# Patient Record
Sex: Female | Born: 1975 | Race: White | Hispanic: No | Marital: Married | Smoking: Never smoker
Health system: Southern US, Community
[De-identification: ages and names within clinical notes are randomized; demographics above are authoritative.]

## PROBLEM LIST (undated history)

## (undated) DIAGNOSIS — D649 Anemia, unspecified: Secondary | ICD-10-CM

## (undated) DIAGNOSIS — E079 Disorder of thyroid, unspecified: Secondary | ICD-10-CM

## (undated) HISTORY — PX: CHOLECYSTECTOMY: SHX55

---

## 2006-10-11 ENCOUNTER — Ambulatory Visit: Payer: Self-pay | Admitting: Otolaryngology

## 2007-05-14 ENCOUNTER — Ambulatory Visit: Payer: Self-pay | Admitting: Otolaryngology

## 2007-06-17 ENCOUNTER — Emergency Department: Payer: Self-pay | Admitting: Emergency Medicine

## 2007-07-14 ENCOUNTER — Ambulatory Visit: Payer: Self-pay | Admitting: Internal Medicine

## 2017-04-20 ENCOUNTER — Emergency Department
Admission: EM | Admit: 2017-04-20 | Discharge: 2017-04-20 | Disposition: A | Discharge status: Home / Self Care | Payer: BC Managed Care – PPO | Attending: Emergency Medicine | Admitting: Emergency Medicine

## 2017-04-20 ENCOUNTER — Encounter: Payer: Self-pay | Admitting: Emergency Medicine

## 2017-04-20 ENCOUNTER — Emergency Department: Payer: BC Managed Care – PPO

## 2017-04-20 DIAGNOSIS — Y939 Activity, unspecified: Secondary | ICD-10-CM | POA: Diagnosis not present

## 2017-04-20 DIAGNOSIS — Y9289 Other specified places as the place of occurrence of the external cause: Secondary | ICD-10-CM | POA: Diagnosis not present

## 2017-04-20 DIAGNOSIS — W19XXXA Unspecified fall, initial encounter: Secondary | ICD-10-CM | POA: Diagnosis not present

## 2017-04-20 DIAGNOSIS — S8255XA Nondisplaced fracture of medial malleolus of left tibia, initial encounter for closed fracture: Secondary | ICD-10-CM | POA: Insufficient documentation

## 2017-04-20 DIAGNOSIS — Y999 Unspecified external cause status: Secondary | ICD-10-CM | POA: Insufficient documentation

## 2017-04-20 DIAGNOSIS — S99912A Unspecified injury of left ankle, initial encounter: Secondary | ICD-10-CM | POA: Diagnosis present

## 2017-04-20 HISTORY — DX: Anemia, unspecified: D64.9

## 2017-04-20 HISTORY — DX: Disorder of thyroid, unspecified: E07.9

## 2017-04-20 MED ORDER — HYDROCODONE-ACETAMINOPHEN 5-325 MG PO TABS: 1.0000 | ORAL_TABLET | Freq: Three times a day (TID) | ORAL | 0 refills | Status: AC | PRN

## 2017-04-20 MED ORDER — KETOROLAC TROMETHAMINE 10 MG PO TABS: 10.0000 mg | ORAL_TABLET | Freq: Three times a day (TID) | ORAL | 0 refills | Status: AC

## 2017-04-20 MED ORDER — KETOROLAC TROMETHAMINE 30 MG/ML IJ SOLN
30.0000 mg | Freq: Once | INTRAMUSCULAR | Status: AC
  Administered 2017-04-20: 30 mg via INTRAVENOUS
  Filled 2017-04-20: qty 1

## 2017-04-20 MED ORDER — HYDROCODONE-ACETAMINOPHEN 5-325 MG PO TABS: 1.0000 | ORAL_TABLET | Freq: Three times a day (TID) | ORAL | 0 refills | Status: DC | PRN

## 2017-04-20 MED ORDER — ORPHENADRINE CITRATE 30 MG/ML IJ SOLN
60.0000 mg | INTRAMUSCULAR | Status: AC
  Administered 2017-04-20: 60 mg via INTRAVENOUS
  Filled 2017-04-20: qty 2

## 2017-04-20 MED ORDER — CYCLOBENZAPRINE HCL 5 MG PO TABS: 5.0000 mg | ORAL_TABLET | Freq: Three times a day (TID) | ORAL | 0 refills | Status: AC | PRN

## 2017-04-20 NOTE — Discharge Instructions (Addendum)
You are being treated for a non-displaced medial ankle fracture. Wear the splint until your are evaluated by Dr. Ernest PineHooten. Call his office tomorrow to schedule a follow-up visit when you return to town. Rest with the leg elevated and apply ice over the splint. Take the pain medicine as needed and the anti-inflammatory and muscle relaxant as directed.

## 2017-04-20 NOTE — ED Triage Notes (Signed)
Pt to ED via EMS from mechanical fall [facility] [employer] [facility]. Pt has + swelling and deformity to LFT ankle. Pt unable to bear weight. Pt denies any other injuries. Per EMS started IV and gave 50 mcg fentanyl on scene and another 25 mcg fent prior to arrival.

## 2017-04-20 NOTE — ED Provider Notes (Signed)
[facility] Emergency Department Provider Note ____________________________________________  Time seen: 1405  I have reviewed the triage vital signs and the nursing notes.  HISTORY  Chief Complaint  Fall  HPI Brenda Golden is a 41 y.o. female presents to the ED via EMS, from the scene of a local restaurant where she sustained a mechanical fall. The patient presents now with pain and disability to the left ankle.There is reported deformity to the left ankle at this time. Patient received 50 g of fentanyl on the scene via IV, and 25 g just prior to arrival in the ED. She denies any other injury at this time.  Past Medical History:  Diagnosis Date  . Anemia   . Thyroid disease     There are no active problems to display for this patient.   Past Surgical History:  Procedure Laterality Date  . CHOLECYSTECTOMY      Prior to Admission medications   Medication Sig Start Date End Date Taking? Authorizing Provider  cyclobenzaprine (FLEXERIL) 5 MG tablet Take 1 tablet (5 mg total) by mouth 3 (three) times daily as needed for muscle spasms. 04/20/17   Destynie Toomey, Charlesetta IvoryJenise V Bacon, PA-C  HYDROcodone-acetaminophen (NORCO) 5-325 MG tablet Take 1 tablet by mouth 3 (three) times daily as needed. 04/20/17 04/30/17  Keaston Pile, Charlesetta IvoryJenise V Bacon, PA-C  ketorolac (TORADOL) 10 MG tablet Take 1 tablet (10 mg total) by mouth every 8 (eight) hours. 04/20/17   Letishia Elliott, Charlesetta IvoryJenise V Bacon, PA-C    Allergies Fluconazole  No family history on file.  Social History Social History  Substance Use Topics  . Smoking status: Never Smoker  . Smokeless tobacco: Never Used  . Alcohol use Yes     Comment: ocasional     Review of Systems  Constitutional: Negative for fever. Cardiovascular: Negative for chest pain. Respiratory: Negative for shortness of breath. Gastrointestinal: Negative for abdominal pain, vomiting and diarrhea. Musculoskeletal: Negative for back pain. Left ankle pain & deformity   Skin: Negative for rash. Neurological: Negative for headaches, focal weakness or numbness. ____________________________________________  PHYSICAL EXAM:  VITAL SIGNS: ED Triage Vitals [04/20/17 1352]  Enc Vitals Group     BP 118/72     Pulse Rate 95     Resp 16     Temp 98.1 F (36.7 C)     Temp Source Oral     SpO2 98 %     Weight 205 lb (93 kg)     Height 5\' 7"  (1.702 m)     Head Circumference      Peak Flow      Pain Score 4     Pain Loc      Pain Edu?      Excl. in GC?     Constitutional: Alert and oriented. Well appearing and in no distress. Head: Normocephalic and atraumatic. Cardiovascular: Normal rate, regular rhythm. Normal distal pulses. Respiratory: Normal respiratory effort.  Musculoskeletal: Nontender with normal range of motion in all extremities.  Neurologic:  Normal gait without ataxia. Normal speech and language. No gross focal neurologic deficits are appreciated. Skin:  Skin is warm, dry and intact. No rash noted. Psychiatric: Mood and affect are normal. Patient exhibits appropriate insight and judgment. ____________________________________________   RADIOLOGY  Left Ankle  IMPRESSION: Nondisplaced intra-articular posterior malleolar left ankle Fracture.  I, Elizardo Chilson, Charlesetta IvoryJenise V Bacon, personally viewed and evaluated these images (plain radiographs) as part of my medical decision making, as well as reviewing the written report by the radiologist. ____________________________________________  PROCEDURES  Short leg OCL splint Crutches ____________________________________________  INITIAL IMPRESSION / ASSESSMENT AND PLAN / ED COURSE  Patient with initial fracture management of a closed medial malleolar fracture. She is placed in an appropriate splint and given crutches for non-weightbearing gait assistance. She will call Dr. Ernest Pine tomorrow for follow-up scheduling ahead of her trip out-of-town. Prescriptions for Norco (#30), ketorolac (#15), and  cyclobenzaprine (#15) are provided.  ____________________________________________  FINAL CLINICAL IMPRESSION(S) / ED DIAGNOSES  Final diagnoses:  Closed traumatic nondisplaced fracture of medial malleolus of left tibia, initial encounter      Lissa Hoard, PA-C 04/20/17 Herbie Baltimore    Loleta Rose, MD 04/20/17 2035

## 2017-04-20 NOTE — ED Notes (Signed)
See triage note for assessment 

## 2018-10-16 ENCOUNTER — Other Ambulatory Visit: Payer: Self-pay

## 2018-10-16 ENCOUNTER — Emergency Department: Payer: BC Managed Care – PPO

## 2018-10-16 ENCOUNTER — Emergency Department
Admission: EM | Admit: 2018-10-16 | Discharge: 2018-10-16 | Disposition: A | Discharge status: Home / Self Care | Payer: BC Managed Care – PPO | Attending: Emergency Medicine | Admitting: Emergency Medicine

## 2018-10-16 DIAGNOSIS — Y92009 Unspecified place in unspecified non-institutional (private) residence as the place of occurrence of the external cause: Secondary | ICD-10-CM | POA: Insufficient documentation

## 2018-10-16 DIAGNOSIS — Y998 Other external cause status: Secondary | ICD-10-CM | POA: Diagnosis not present

## 2018-10-16 DIAGNOSIS — Y9389 Activity, other specified: Secondary | ICD-10-CM | POA: Insufficient documentation

## 2018-10-16 DIAGNOSIS — W010XXA Fall on same level from slipping, tripping and stumbling without subsequent striking against object, initial encounter: Secondary | ICD-10-CM | POA: Insufficient documentation

## 2018-10-16 DIAGNOSIS — S0990XA Unspecified injury of head, initial encounter: Secondary | ICD-10-CM | POA: Diagnosis present

## 2018-10-16 DIAGNOSIS — S060X1A Concussion with loss of consciousness of 30 minutes or less, initial encounter: Secondary | ICD-10-CM | POA: Diagnosis not present

## 2018-10-16 NOTE — ED Notes (Signed)
Pt slipped and fell hitting her head on concrete at 1545. No vomiting. IBU taken at home.

## 2018-10-16 NOTE — ED Triage Notes (Signed)
PT States she slipped and fell on a rug, falling backwards and striking her head, states she did having LOC for a minute. Pt is a/ox4, PERRLA, pt c/o HA with nausea and dizziness. Denies any deficits at present.

## 2018-10-16 NOTE — ED Provider Notes (Signed)
[facility] Emergency Department Provider Note  ____________________________________________   First MD Initiated Contact with Patient 10/16/18 1955     (approximate)  I have reviewed the triage vital signs and the nursing notes.   HISTORY  Chief Complaint Head Injury    HPI Brenda Golden is a 42 y.o. female presents to the emergency department following accidental slip and fall resultant occipital head injury.  Patient states that she slipped and fell striking the back of her head with approximately 1 minute loss of consciousness..  Patient states that she has had headache nausea and dizziness since the event however states that the dizziness has resolved headache has persisted but is mild at this time.  Patient denies any weakness no numbness or change in speech.   Past Medical History:  Diagnosis Date  . Anemia   . Thyroid disease     There are no active problems to display for this patient.   Past Surgical History:  Procedure Laterality Date  . CHOLECYSTECTOMY      Prior to Admission medications   Medication Sig Start Date End Date Taking? Authorizing Provider  cyclobenzaprine (FLEXERIL) 5 MG tablet Take 1 tablet (5 mg total) by mouth 3 (three) times daily as needed for muscle spasms. 04/20/17   Menshew, Charlesetta IvoryJenise V Bacon, PA-C  ketorolac (TORADOL) 10 MG tablet Take 1 tablet (10 mg total) by mouth every 8 (eight) hours. 04/20/17   Menshew, Charlesetta IvoryJenise V Bacon, PA-C    Allergies Fluconazole  No family history on file.  Social History Social History   Tobacco Use  . Smoking status: Never Smoker  . Smokeless tobacco: Never Used  Substance Use Topics  . Alcohol use: Yes    Comment: ocasional   . Drug use: No    Review of Systems Constitutional: No fever/chills Eyes: No visual changes. ENT: No sore throat. Cardiovascular: Denies chest pain. Respiratory: Denies shortness of breath. Gastrointestinal: No abdominal pain.  No nausea, no vomiting.   No diarrhea.  No constipation. Genitourinary: Negative for dysuria. Musculoskeletal: Negative for neck pain.  Negative for back pain. Integumentary: Negative for rash. Neurological: Negative for headaches, focal weakness or numbness.   ____________________________________________   PHYSICAL EXAM:  VITAL SIGNS: ED Triage Vitals  Enc Vitals Group     BP 10/16/18 1714 116/90     Pulse Rate 10/16/18 1714 76     Resp 10/16/18 1714 17     Temp 10/16/18 1714 98.1 F (36.7 C)     Temp Source 10/16/18 1714 Oral     SpO2 10/16/18 1714 98 %     Weight 10/16/18 1715 97.5 kg (215 lb)     Height 10/16/18 1715 1.702 m (5\' 7" )     Head Circumference --      Peak Flow --      Pain Score 10/16/18 1715 7     Pain Loc --      Pain Edu? --      Excl. in GC? --     Constitutional: Alert and oriented. Well appearing and in no acute distress. Eyes: Conjunctivae are normal. PERRL. EOMI. Head: Atraumatic. Mouth/Throat: Mucous membranes are moist.  Oropharynx non-erythematous. Neck: No stridor.  No cervical spine tenderness to palpation. Cardiovascular: Normal rate, regular rhythm. Good peripheral circulation. Grossly normal heart sounds. Respiratory: Normal respiratory effort.  No retractions. Lungs CTAB. Musculoskeletal: No lower extremity tenderness nor edema. No gross deformities of extremities. Neurologic:  Normal speech and language. No gross focal neurologic deficits are  appreciated.  Skin:  Skin is warm, dry and intact. No rash noted. Psychiatric: Mood and affect are normal. Speech and behavior are normal.  ____________________________________________ ____  RADIOLOGY I, Darci CurrentANDOLPH N Avian Greenawalt, personally viewed and evaluated these images (plain radiographs) as part of my medical decision making, as well as reviewing the written report by the radiologist.  ED MD interpretation: No focal acute intracranial abnormality identified on CT head per radiologist.  Official radiology report(s): Ct  Head Wo Contrast  Result Date: 10/16/2018 CLINICAL DATA:  Status post fall hitting back of head. EXAM: CT HEAD WITHOUT CONTRAST TECHNIQUE: Contiguous axial images were obtained from the base of the skull through the vertex without intravenous contrast. COMPARISON:  None. FINDINGS: Brain: No evidence of acute infarction, hemorrhage, hydrocephalus, extra-axial collection or mass lesion/mass effect. Vascular: No hyperdense vessel or unexpected calcification. Skull: Normal. Negative for fracture or focal lesion. Sinuses/Orbits: No acute finding. Other: None. IMPRESSION: No focal acute intracranial abnormality identified. Electronically Signed   By: Sherian ReinWei-Chen  Lin M.D.   On: 10/16/2018 17:48     Procedures   ____________________________________________   INITIAL IMPRESSION / ASSESSMENT AND PLAN / ED COURSE  As part of my medical decision making, I reviewed the following data within the electronic MEDICAL RECORD NUMBER  42 year old female presented with above-stated history and physical exam concerning for concussion versus other potential intracranial abnormality.  CT head revealed no acute intracranial findings.  Patient symptoms consistent with a concussion.  Spoke with the patient at length regarding concussion. ____________________________________________  FINAL CLINICAL IMPRESSION(S) / ED DIAGNOSES  Final diagnoses:  Concussion with loss of consciousness of 30 minutes or less, initial encounter     MEDICATIONS GIVEN DURING THIS VISIT:  Medications - No data to display   ED Discharge Orders    None       Note:  This document was prepared using Dragon voice recognition software and may include unintentional dictation errors.    Darci CurrentBrown, [place] N, MD 10/16/18 [phone]

## 2018-10-26 ENCOUNTER — Other Ambulatory Visit: Payer: Self-pay

## 2018-10-26 ENCOUNTER — Emergency Department
Admission: EM | Admit: 2018-10-26 | Discharge: 2018-10-26 | Disposition: A | Discharge status: Home / Self Care | Payer: BC Managed Care – PPO | Attending: Emergency Medicine | Admitting: Emergency Medicine

## 2018-10-26 ENCOUNTER — Emergency Department: Payer: BC Managed Care – PPO

## 2018-10-26 DIAGNOSIS — Z9049 Acquired absence of other specified parts of digestive tract: Secondary | ICD-10-CM | POA: Diagnosis not present

## 2018-10-26 DIAGNOSIS — R140 Abdominal distension (gaseous): Secondary | ICD-10-CM | POA: Diagnosis not present

## 2018-10-26 DIAGNOSIS — R1084 Generalized abdominal pain: Secondary | ICD-10-CM

## 2018-10-26 DIAGNOSIS — R109 Unspecified abdominal pain: Secondary | ICD-10-CM | POA: Diagnosis present

## 2018-10-26 LAB — URINALYSIS, COMPLETE (UACMP) WITH MICROSCOPIC
BILIRUBIN URINE: NEGATIVE
Bacteria, UA: NONE SEEN
GLUCOSE, UA: NEGATIVE mg/dL
Ketones, ur: NEGATIVE mg/dL
LEUKOCYTES UA: NEGATIVE
NITRITE: NEGATIVE
PH: 5 (ref 5.0–8.0)
Protein, ur: NEGATIVE mg/dL
SPECIFIC GRAVITY, URINE: 1.026 (ref 1.005–1.030)

## 2018-10-26 LAB — COMPREHENSIVE METABOLIC PANEL
ALK PHOS: 75 U/L (ref 38–126)
ALT: 54 U/L — ABNORMAL HIGH (ref 0–44)
ANION GAP: 9 (ref 5–15)
AST: 27 U/L (ref 15–41)
Albumin: 4.2 g/dL (ref 3.5–5.0)
BUN: 13 mg/dL (ref 6–20)
CALCIUM: 8.6 mg/dL — AB (ref 8.9–10.3)
CO2: 22 mmol/L (ref 22–32)
Chloride: 102 mmol/L (ref 98–111)
Creatinine, Ser: 0.83 mg/dL (ref 0.44–1.00)
GFR calc non Af Amer: >60 mL/min (ref >60)
Glucose, Bld: 105 mg/dL — ABNORMAL HIGH (ref 70–99)
Potassium: 3.5 mmol/L (ref 3.5–5.1)
Sodium: 133 mmol/L — ABNORMAL LOW (ref 135–145)
Total Bilirubin: 0.6 mg/dL (ref 0.3–1.2)
Total Protein: 7.7 g/dL (ref 6.5–8.1)

## 2018-10-26 LAB — CBC
HCT: 46.3 % — ABNORMAL HIGH (ref 36.0–46.0)
Hemoglobin: 15 g/dL (ref 12.0–15.0)
MCH: 29.8 pg (ref 26.0–34.0)
MCHC: 32.4 g/dL (ref 30.0–36.0)
MCV: 91.9 fL (ref 80.0–100.0)
NRBC: 0 % (ref 0.0–0.2)
PLATELETS: 318 10*3/uL (ref 150–400)
RBC: 5.04 MIL/uL (ref 3.87–5.11)
RDW: 12.4 % (ref 11.5–15.5)
WBC: 9.4 10*3/uL (ref 4.0–10.5)

## 2018-10-26 LAB — LIPASE, BLOOD: LIPASE: 24 U/L (ref 11–51)

## 2018-10-26 LAB — POCT PREGNANCY, URINE: Preg Test, Ur: NEGATIVE

## 2018-10-26 MED ORDER — ONDANSETRON 4 MG PO TBDP: 4.0000 mg | ORAL_TABLET | Freq: Three times a day (TID) | ORAL | 0 refills | Status: AC | PRN

## 2018-10-26 MED ORDER — SIMETHICONE 80 MG PO CHEW
80.0000 mg | CHEWABLE_TABLET | Freq: Once | ORAL | Status: AC
  Administered 2018-10-26: 80 mg via ORAL
  Filled 2018-10-26: qty 1

## 2018-10-26 MED ORDER — KETOROLAC TROMETHAMINE 30 MG/ML IJ SOLN
30.0000 mg | Freq: Once | INTRAMUSCULAR | Status: AC
  Administered 2018-10-26: 30 mg via INTRAMUSCULAR
  Filled 2018-10-26: qty 1

## 2018-10-26 MED ORDER — SIMETHICONE 80 MG PO CHEW: 80.0000 mg | CHEWABLE_TABLET | Freq: Four times a day (QID) | ORAL | 0 refills | Status: AC | PRN

## 2018-10-26 MED ORDER — ONDANSETRON 4 MG PO TBDP
4.0000 mg | ORAL_TABLET | Freq: Once | ORAL | Status: AC
  Administered 2018-10-26: 4 mg via ORAL
  Filled 2018-10-26: qty 1

## 2018-10-26 NOTE — ED Triage Notes (Signed)
Pt states that she started taking a weight loss medication mon, tues, and wed, wed became sick with vomiting, upper abd pain radiating around to her back (reminds her of her gallbladder issues prior to surg)  Pt states that she cont to have a lot of gas, and diarrhea with the abd pain

## 2018-10-26 NOTE — ED Notes (Signed)

## 2018-10-26 NOTE — Discharge Instructions (Addendum)
Please drink plenty of fluid to stay well-hydrated.  To decrease gas in your stomach, you may take simethicone, and stay active.  Walking can help move gas out of your bowel.  You may take Zofran if you develop nausea or vomiting.  You may take Tylenol or Motrin for pain.  If you take Motrin or other NSAID medications, take it with a small amount of food to prevent irritation of your stomach.  Return to the emergency department if you develop severe pain, lightheadedness or fainting, fever, or any other symptoms concerning to you.

## 2018-10-26 NOTE — ED Provider Notes (Signed)
[facility] Emergency Department Provider Note  ____________________________________________  Time seen: Approximately 2:31 PM  I have reviewed the triage vital signs and the nursing notes.   HISTORY  Chief Complaint Abdominal Pain; Emesis; and Diarrhea    HPI Brenda Golden is a 43 y.o. female that is post remote cholecystectomy, nonpregnant, presenting with diffuse abdominal pain, nausea and vomiting now resolved.  The patient reports that 5 days ago, she started an over-the-counter weight loss medication which she has tolerated before.  3 days later, she had multiple episodes of nausea, vomiting and nonbloody diarrhea, which have since resolved.  However, she also developed and upper abdominal pain that radiated around to the back and felt like a cramping sensation and has been persistent.  It is worse if she eats or drinks.  She is able to tolerate liquids and solids without vomiting however.  Yesterday, she took Motrin for mild headache, and this did not help her abdominal pain.  No fevers or chills, dysuria, change in vaginal discharge.  Past Medical History:  Diagnosis Date  . Anemia   . Thyroid disease     There are no active problems to display for this patient.   Past Surgical History:  Procedure Laterality Date  . CHOLECYSTECTOMY      Current Outpatient Rx  . Order #: [id number] Class: Print  . Order #: [id number] Class: Print  . Order #: [id number] Class: Print  . Order #: [id number] Class: Print    Allergies Fluconazole  No family history on file.  Social History Social History   Tobacco Use  . Smoking status: Never Smoker  . Smokeless tobacco: Never Used  Substance Use Topics  . Alcohol use: Yes    Comment: ocasional   . Drug use: No    Review of Systems Constitutional: No fever/chills.  No lightheadedness or syncope. Eyes: No visual changes. ENT: No sore throat. No congestion or rhinorrhea. Cardiovascular: Denies chest pain. Denies  palpitations. Respiratory: Denies shortness of breath.  No cough. Gastrointestinal: Positive upper abdominal pain radiating to the back.  Positive nausea vomiting and diarrhea; vomiting and diarrhea now resolved..  No constipation. Genitourinary: Negative for dysuria.  No urinary frequency.  No change in vaginal discharge. Musculoskeletal: Radiating pain to the back.   Skin: Negative for rash. Neurological: Negative for headaches. No focal numbness, tingling or weakness.     ____________________________________________   PHYSICAL EXAM:  VITAL SIGNS: ED Triage Vitals  Enc Vitals Group     BP 10/26/18 1144 106/89     Pulse Rate 10/26/18 1144 96     Resp 10/26/18 1144 16     Temp 10/26/18 1144 98.2 F (36.8 C)     Temp Source 10/26/18 1144 Oral     SpO2 10/26/18 1144 97 %     Weight 10/26/18 1145 215 lb (97.5 kg)     Height 10/26/18 1145 5\' 7"  (1.702 m)     Head Circumference --      Peak Flow --      Pain Score 10/26/18 1145 8     Pain Loc --      Pain Edu? --      Excl. in GC? --     Constitutional: Alert and oriented.  Answers questions appropriately. Eyes: Conjunctivae are normal.  EOMI. No scleral icterus. Head: Atraumatic. Nose: No congestion/rhinnorhea. Mouth/Throat: Mucous membranes are mildly dry.  Neck: No stridor.  Supple.  No JVD.  No meningismus. Cardiovascular: Normal rate, regular rhythm. No murmurs, rubs or gallops.  Respiratory: Normal respiratory effort.  No accessory muscle use or retractions. Lungs CTAB.  No wheezes, rales or ronchi. Gastrointestinal: Soft, and mild distention.  Tender to palpation diffusely in all 4 quadrants.  No guarding or rebound.  No peritoneal signs. Musculoskeletal: No LE edema. No ttp in the calves or palpable cords.  Negative Homan's sign. Neurologic:  A&Ox3.  Speech is clear.  Face and smile are symmetric.  EOMI.  Moves all extremities well. Skin:  Skin is warm, dry and intact. No rash noted. Psychiatric: Mood and affect are  normal. Speech and behavior are normal.  Normal judgement.  ____________________________________________   LABS (all labs ordered are listed, but only abnormal results are displayed)  Labs Reviewed  COMPREHENSIVE METABOLIC PANEL - Abnormal; Notable for the following components:      Result Value   Sodium 133 (*)    Glucose, Bld 105 (*)    Calcium 8.6 (*)    ALT 54 (*)    All other components within normal limits  CBC - Abnormal; Notable for the following components:   HCT 46.3 (*)    All other components within normal limits  URINALYSIS, COMPLETE (UACMP) WITH MICROSCOPIC - Abnormal; Notable for the following components:   Color, Urine YELLOW (*)    APPearance HAZY (*)    Hgb urine dipstick MODERATE (*)    All other components within normal limits  LIPASE, BLOOD  POC URINE PREG, ED  POCT PREGNANCY, URINE   ____________________________________________  EKG  ED ECG REPORT I, Anne-Caroline Sharma Covert, the attending physician, personally viewed and interpreted this ECG.   Date: 10/26/2018  EKG Time: 1452  Rate: 71  Rhythm: normal sinus rhythm  Axis: normal  Intervals:none  ST&T Change: No STEMI  ____________________________________________  RADIOLOGY  Dg Abdomen 1 View  Result Date: 10/26/2018 CLINICAL DATA:  Upper abdominal pain with vomiting and diarrhea. EXAM: ABDOMEN - 1 VIEW COMPARISON:  None. FINDINGS: There are a few mildly prominent, nondilated loops of small bowel. No obstruction. No radio-opaque calculi or other significant radiographic abnormality are seen. Prior cholecystectomy. Surgical clip in the right lower quadrant. No acute osseous abnormality. IMPRESSION: 1. A few mildly prominent, nondilated loops of small bowel are nonspecific, but could reflect enteritis. No obstruction. Electronically Signed   By: Obie Dredge M.D.   On: 10/26/2018 14:50    ____________________________________________   PROCEDURES  Procedure(s) performed:  None  Procedures  Critical Care performed: No ____________________________________________   INITIAL IMPRESSION / ASSESSMENT AND PLAN / ED COURSE  Pertinent labs & imaging results that were available during my care of the patient were reviewed by me and considered in my medical decision making (see chart for details).  43 y.o. female status post remote cholecystectomy, overweight, presenting with resolved vomiting and diarrhea, with ongoing abdominal distention and diffuse abdominal pain.  Overall, the patient's hemodynamically stable and afebrile.  Her clinical presentation is most consistent with gas and I will treat her with simethicone.  Given that she is no longer having vomiting, obstruction is less likely.  I do not suspect an acute severe intra-abdominal infectious or surgical pathology, including diverticulitis, appendicitis.  We will get a plain x-ray, and if it is reassuring and she is feeling better, I will plan to discharge her home.  Follow-up instructions as well as return precautions were discussed.  ----------------------------------------- 4:01 PM on 10/26/2018 -----------------------------------------  The patient's work-up in the emergency department has been reassuring.  She states that her pain has almost completely resolved and that  she is feeling significantly better.  She is able to tolerate liquids without any difficulty.  She has been able to ambulate without any pain.  The patient's x-ray did show some mildly distended loops of bowel, which is consistent with her distention; her imaging and clinical presentation are not consistent with obstruction.  At this time, the patient is safe for discharge home.  Return precautions as well as follow-up instructions were discussed.  ____________________________________________  FINAL CLINICAL IMPRESSION(S) / ED DIAGNOSES  Final diagnoses:  Abdominal distention  Generalized abdominal pain         NEW MEDICATIONS  STARTED DURING THIS VISIT:  New Prescriptions   ONDANSETRON (ZOFRAN ODT) 4 MG DISINTEGRATING TABLET    Take 1 tablet (4 mg total) by mouth every 8 (eight) hours as needed for nausea or vomiting.   SIMETHICONE (GAS-X) 80 MG CHEWABLE TABLET    Chew 1 tablet (80 mg total) by mouth 4 (four) times daily as needed for flatulence.      Rockne MenghiniNorman, Anne-Caroline, MD 10/26/18 [phone]

## 2020-06-17 IMAGING — CT CT HEAD W/O CM
3 series · 15 of 45 positions shown, 18 images · non-contrast
Comparison: None.

CLINICAL DATA: Status post fall hitting back of head.

EXAM:
CT HEAD WITHOUT CONTRAST
TECHNIQUE: Contiguous axial images were obtained from the base of the skull
through the vertex without intravenous contrast.

[Series 3: head wo · axial · 0.41mm/px · z∈[-116,-1]mm · 9 of 28 slices shown, 12 images]
[im 3/28  brain]
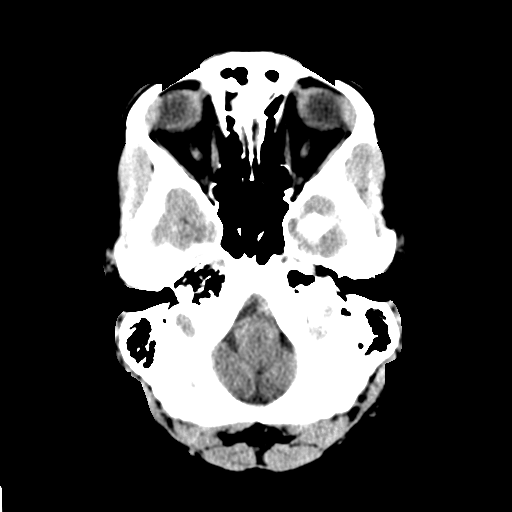
[im 3/28  bone]
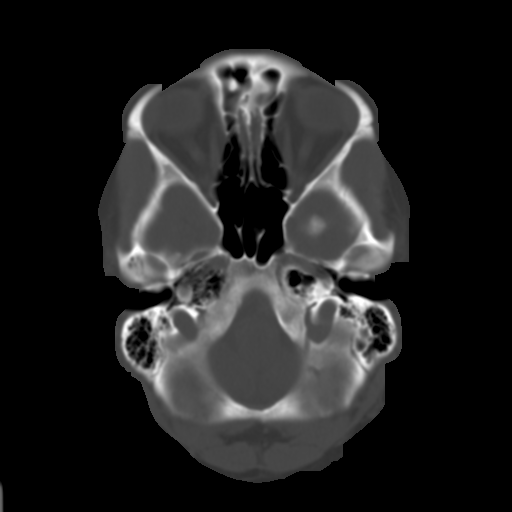
[im 6/28  brain]
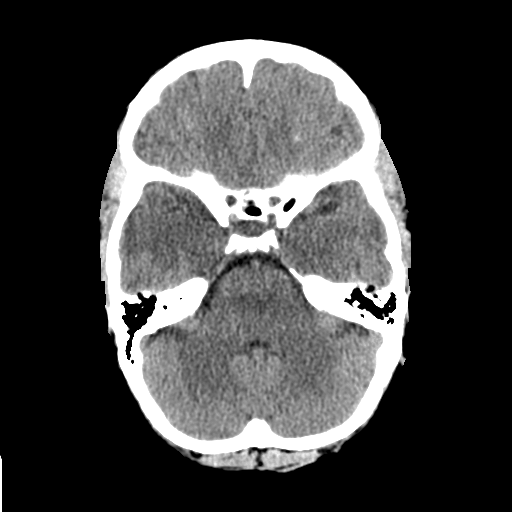
[im 9/28  brain]
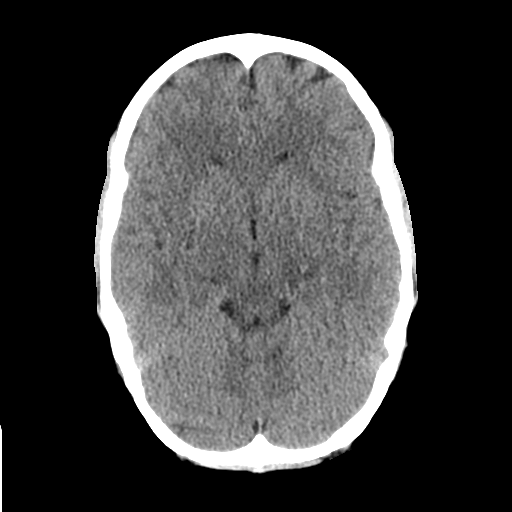
[im 12/28  brain]
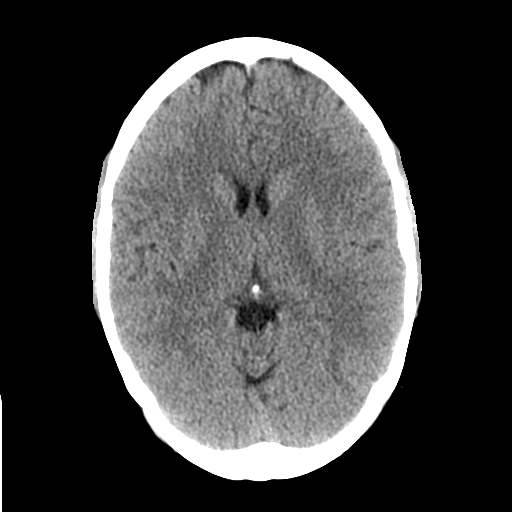
[im 15/28  brain]
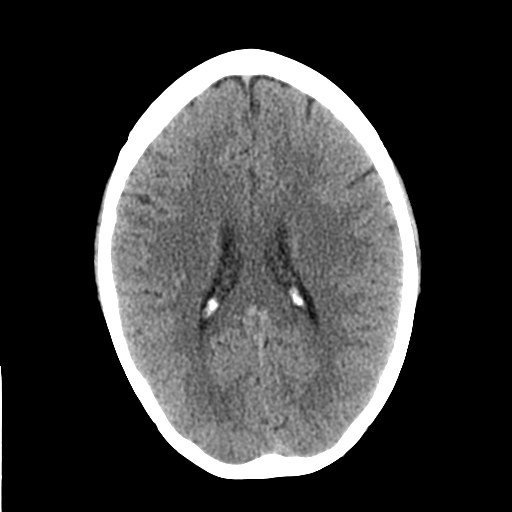
[im 15/28  bone]
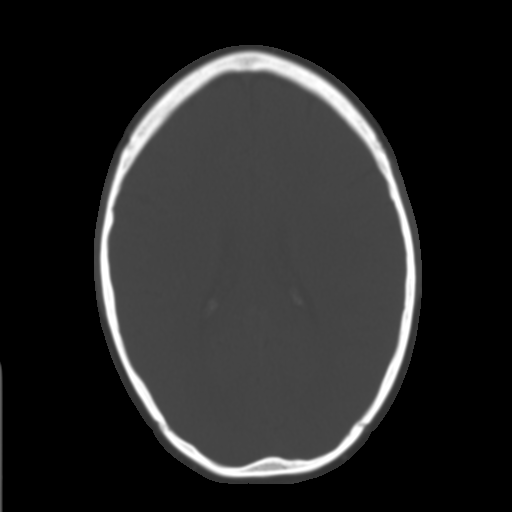
[im 17/28  brain]
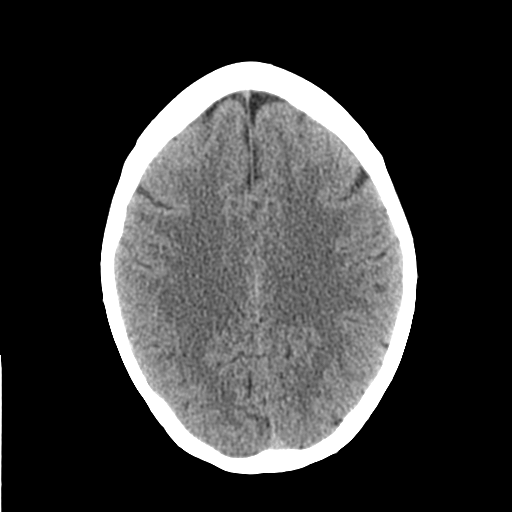
[im 20/28  brain]
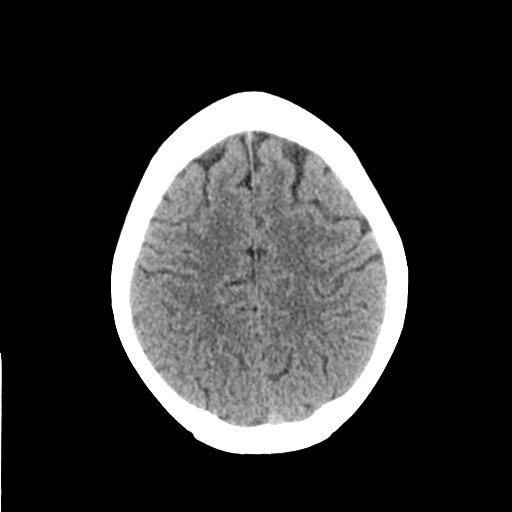
[im 23/28  brain]
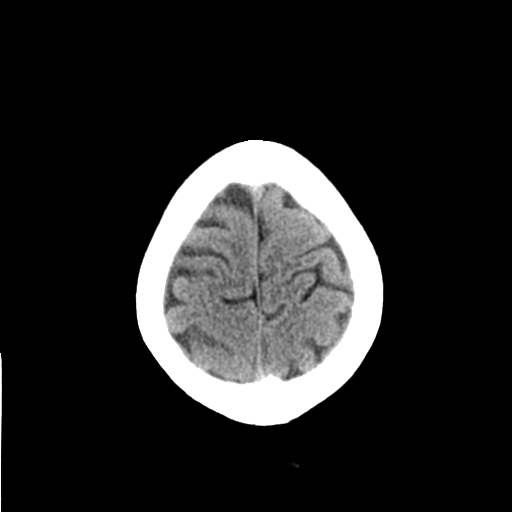
[im 26/28  brain]
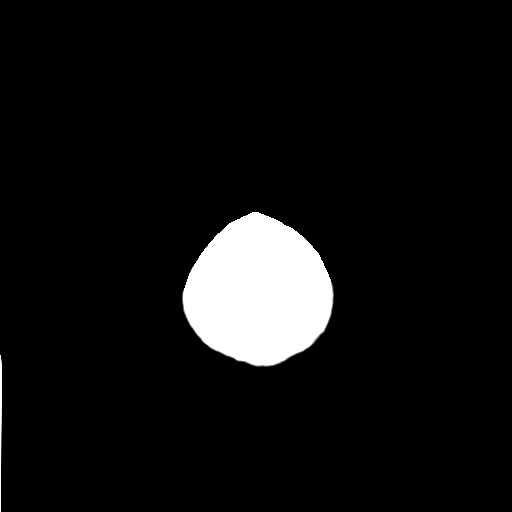
[im 26/28  bone]
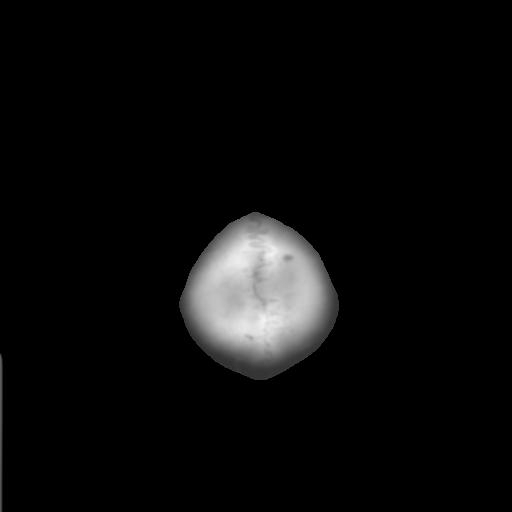

[Series 4: coronal soft tissue · coronal · 0.30mm/px · 3 of 63 slices shown]
[im 21/63  brain]
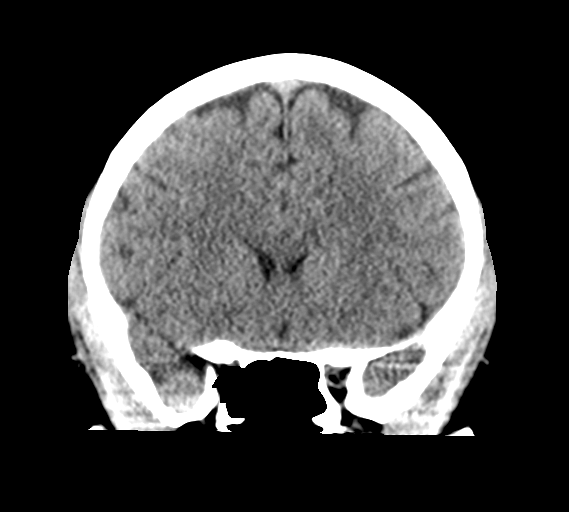
[im 28/63  brain]
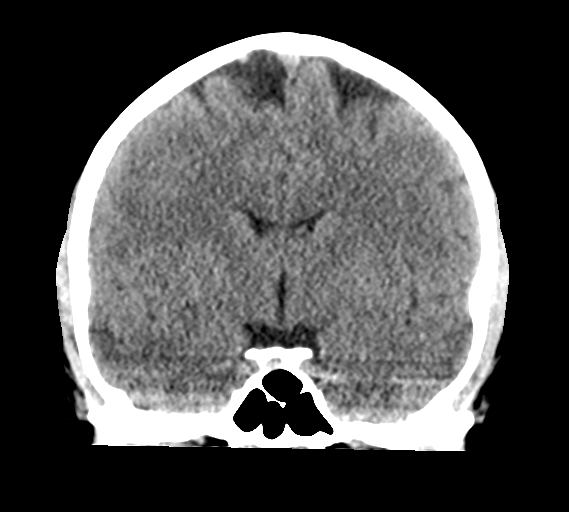
[im 35/63  brain]
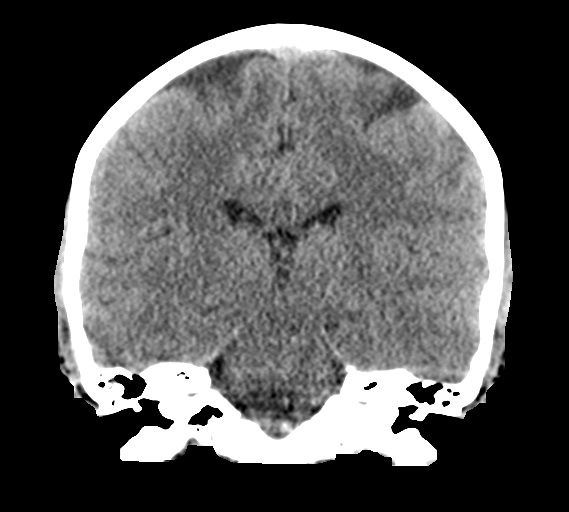

[Series 5: sagittal soft tissue · sagittal · 0.31mm/px · 3 of 48 slices shown]
[im 16/48  brain]
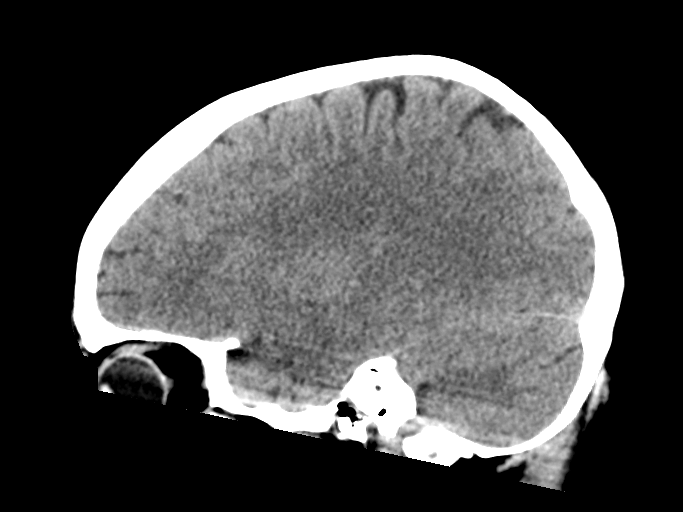
[im 24/48  brain]
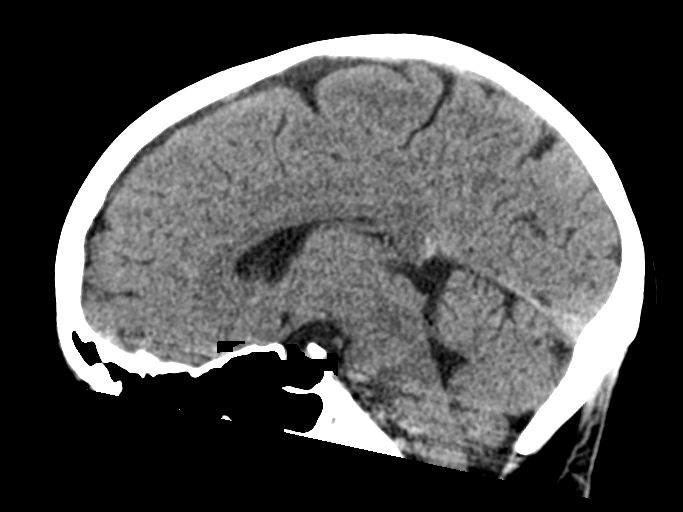
[im 32/48  brain]
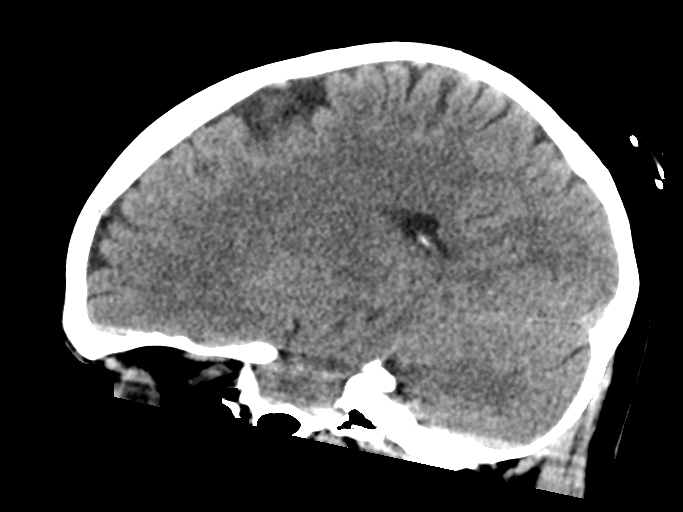

[15 of 45 positions shown; findings below may reference images not displayed]

FINDINGS: Brain: No evidence of acute infarction, hemorrhage, hydrocephalus,
extra-axial collection or mass lesion/mass effect.

Vascular: No hyperdense vessel or unexpected calcification.

Skull: Normal. Negative for fracture or focal lesion.

Sinuses/Orbits: No acute finding.

Other: None.
IMPRESSION: No focal acute intracranial abnormality identified.

## 2020-06-27 IMAGING — DX DG ABDOMEN 1V
2 series · 2 of 2 positions shown · non-contrast
Comparison: None.

CLINICAL DATA: Upper abdominal pain with vomiting and diarrhea.

EXAM:
ABDOMEN - 1 VIEW

[abdomen supine (1 of 2)]
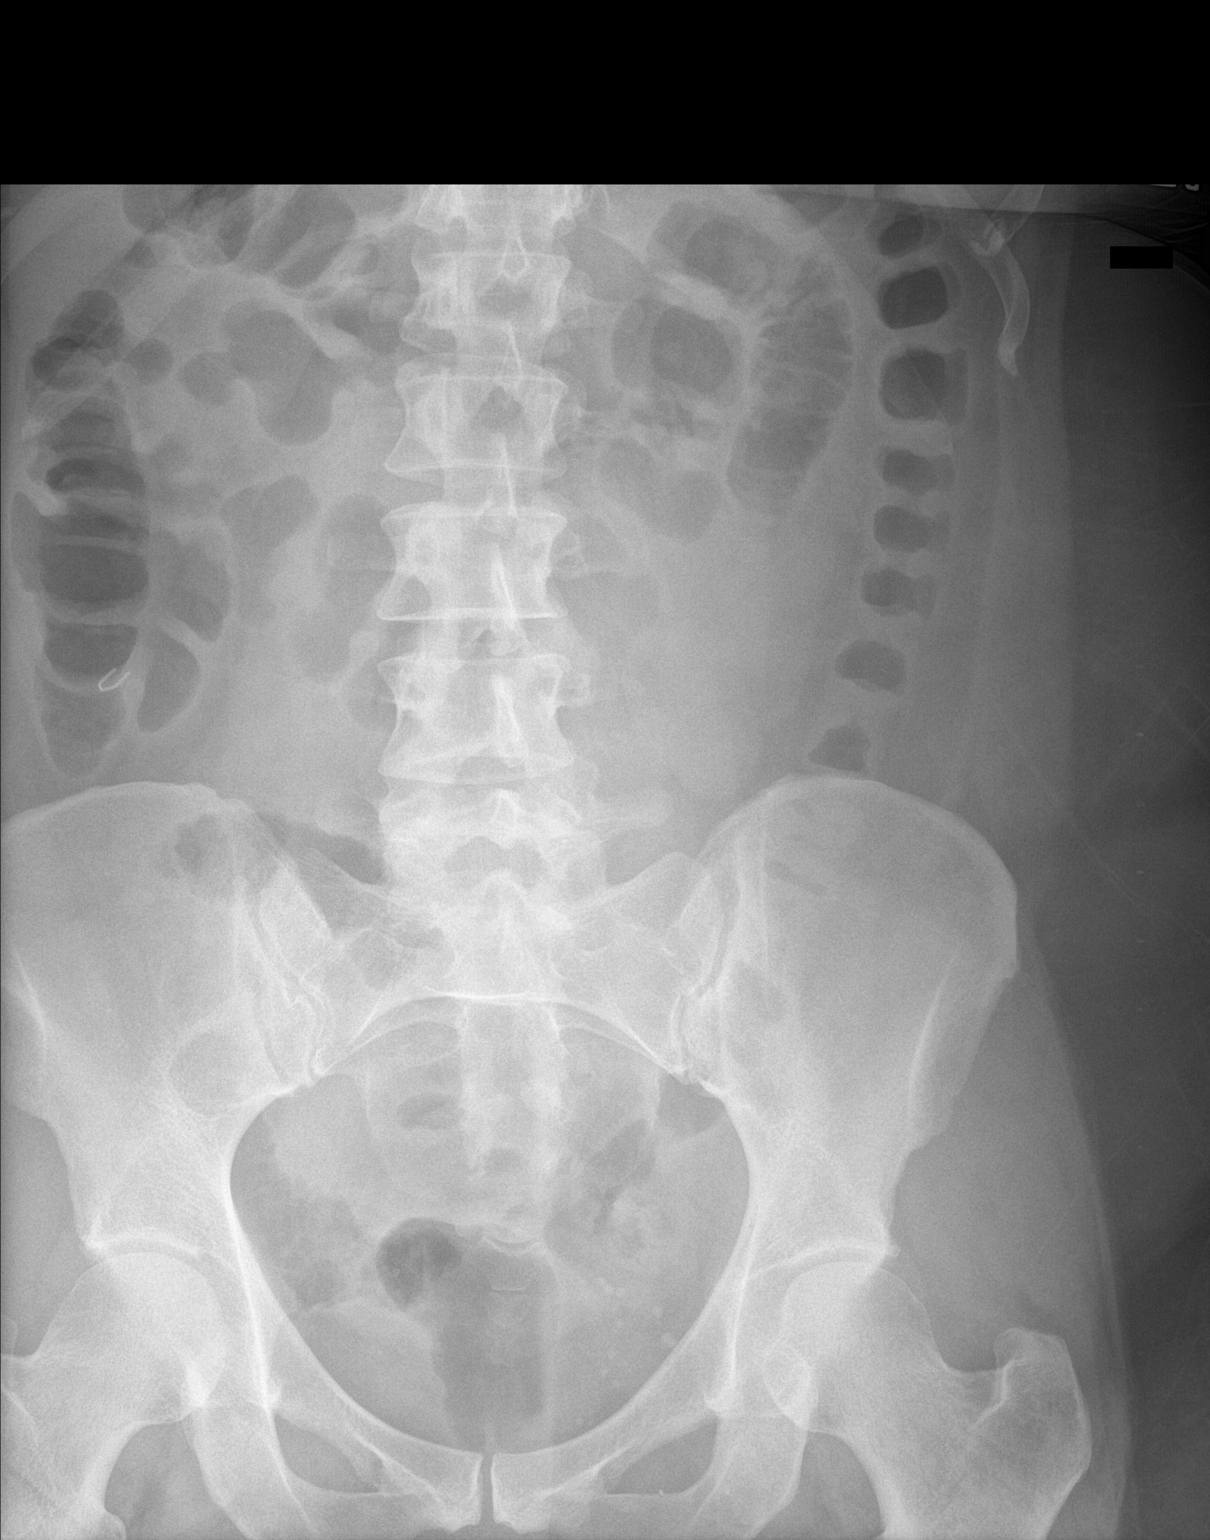

[abdomen supine (2 of 2)]
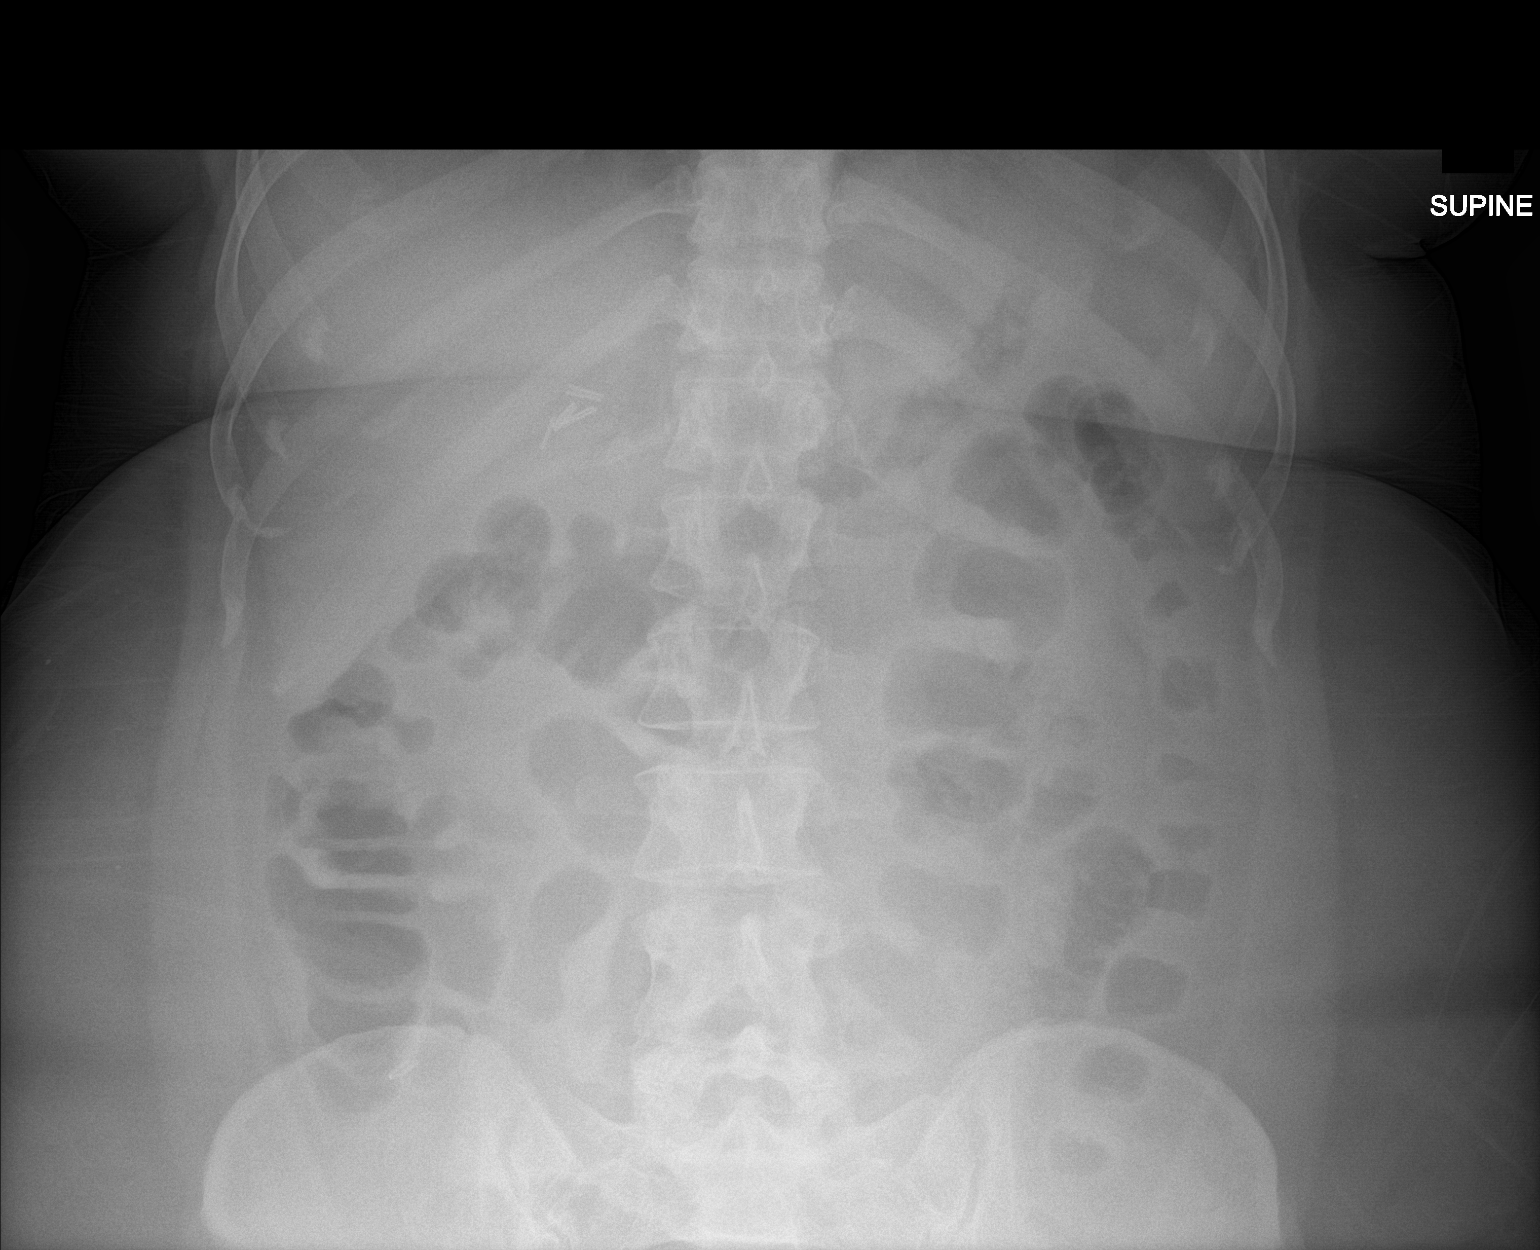

[2 of 2 positions shown; findings below may reference images not displayed]

FINDINGS: There are a few mildly prominent, nondilated loops of small bowel.
No obstruction. No radio-opaque calculi or other significant
radiographic abnormality are seen. Prior cholecystectomy. Surgical
clip in the right lower quadrant. No acute osseous abnormality.
IMPRESSION: 1. A few mildly prominent, nondilated loops of small bowel are
nonspecific, but could reflect enteritis. No obstruction.
